# Patient Record
Sex: Male | Born: 1955 | Race: White | Hispanic: No
Health system: Southern US, Community
[De-identification: ages and names within clinical notes are randomized; demographics above are authoritative.]

---

## 2014-07-25 ENCOUNTER — Ambulatory Visit (INDEPENDENT_AMBULATORY_CARE_PROVIDER_SITE_OTHER): Payer: Self-pay

## 2014-07-25 ENCOUNTER — Other Ambulatory Visit: Payer: Self-pay | Admitting: Adult Health

## 2014-07-25 ENCOUNTER — Ambulatory Visit (INDEPENDENT_AMBULATORY_CARE_PROVIDER_SITE_OTHER): Payer: Worker's Compensation

## 2014-07-25 ENCOUNTER — Ambulatory Visit (HOSPITAL_COMMUNITY)
Admission: RE | Admit: 2014-07-25 | Discharge: 2014-07-25 | Disposition: A | Discharge status: Home / Self Care | Payer: Self-pay | Source: Ambulatory Visit | Attending: Adult Health | Admitting: Adult Health

## 2014-07-25 DIAGNOSIS — S0990XA Unspecified injury of head, initial encounter: Secondary | ICD-10-CM | POA: Diagnosis not present

## 2014-07-25 DIAGNOSIS — IMO0002 Reserved for concepts with insufficient information to code with codable children: Secondary | ICD-10-CM

## 2014-07-25 DIAGNOSIS — T1490XA Injury, unspecified, initial encounter: Secondary | ICD-10-CM
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
